# Patient Record
Sex: Male | Born: 1975 | Race: White | Hispanic: No | Smoking: Never smoker
Health system: Southern US, Community
[De-identification: ages and names within clinical notes are randomized; demographics above are authoritative.]

## PROBLEM LIST (undated history)

## (undated) DIAGNOSIS — E785 Hyperlipidemia, unspecified: Secondary | ICD-10-CM

## (undated) DIAGNOSIS — T7840XA Allergy, unspecified, initial encounter: Secondary | ICD-10-CM

## (undated) DIAGNOSIS — I10 Essential (primary) hypertension: Secondary | ICD-10-CM

## (undated) HISTORY — PX: NO PAST SURGERIES: SHX2092

## (undated) HISTORY — DX: Hyperlipidemia, unspecified: E78.5

## (undated) HISTORY — DX: Allergy, unspecified, initial encounter: T78.40XA

## (undated) HISTORY — DX: Essential (primary) hypertension: I10

---

## 2010-01-14 ENCOUNTER — Encounter: Admission: RE | Admit: 2010-01-14 | Discharge: 2010-01-14 | Payer: Self-pay | Admitting: Occupational Medicine

## 2010-09-24 ENCOUNTER — Emergency Department (HOSPITAL_BASED_OUTPATIENT_CLINIC_OR_DEPARTMENT_OTHER)
Admission: EM | Admit: 2010-09-24 | Discharge: 2010-09-24 | Payer: Self-pay | Source: Home / Self Care | Admitting: Emergency Medicine

## 2014-10-13 ENCOUNTER — Other Ambulatory Visit: Payer: Self-pay | Admitting: Occupational Medicine

## 2014-10-13 ENCOUNTER — Ambulatory Visit
Admission: RE | Admit: 2014-10-13 | Discharge: 2014-10-13 | Disposition: A | Discharge status: Home / Self Care | Payer: No Typology Code available for payment source | Source: Ambulatory Visit | Attending: Occupational Medicine | Admitting: Occupational Medicine

## 2014-10-13 DIAGNOSIS — Z139 Encounter for screening, unspecified: Secondary | ICD-10-CM

## 2015-05-11 IMAGING — CR DG CHEST 1V
1 series · 1 of 1 positions shown · non-contrast
Comparison: 01/14/2010

CLINICAL DATA: Annual recertification screening for employment [REDACTED]
[employer]

EXAM:
CHEST - 1 VIEW

[view not recorded]
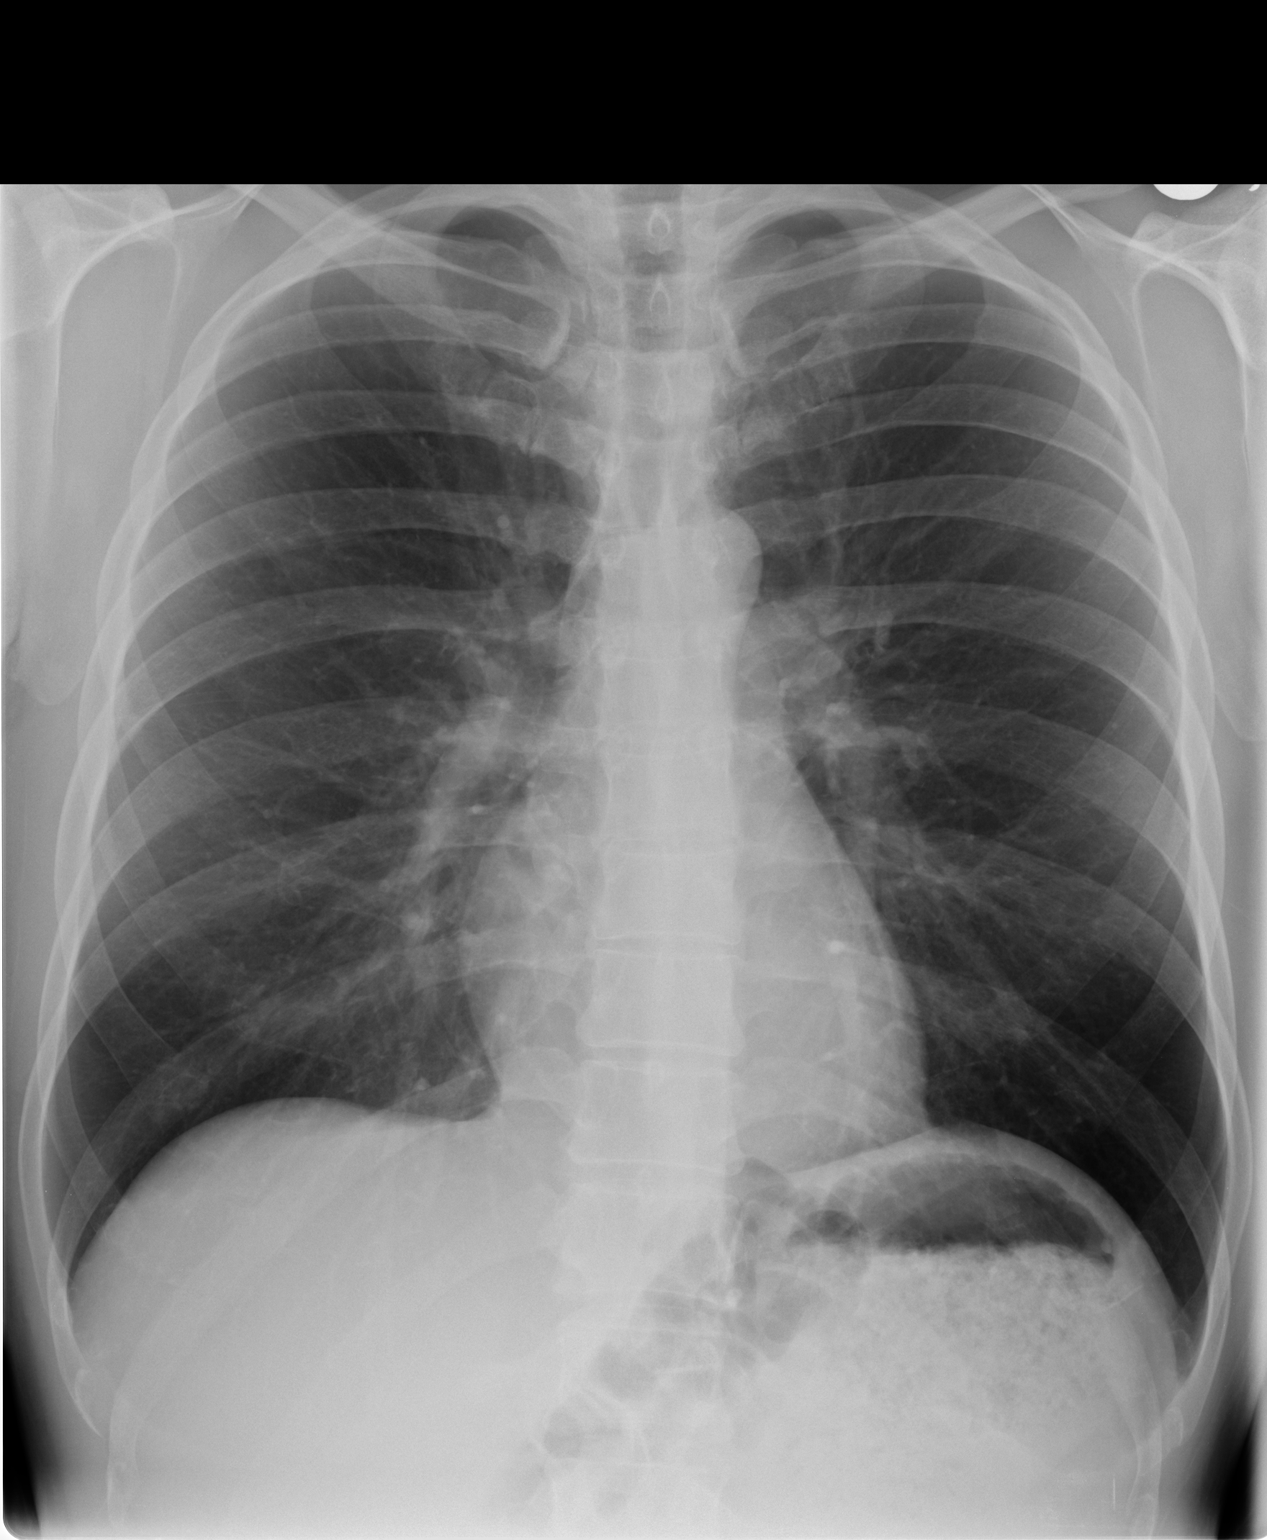

[1 of 1 positions shown; findings below may reference images not displayed]

FINDINGS: The heart size and mediastinal contours are within normal limits.
Both lungs are clear. The visualized skeletal structures are
unremarkable.
IMPRESSION: No active disease.

## 2016-03-17 ENCOUNTER — Encounter (HOSPITAL_BASED_OUTPATIENT_CLINIC_OR_DEPARTMENT_OTHER): Payer: Self-pay | Admitting: Emergency Medicine

## 2016-03-17 ENCOUNTER — Emergency Department (HOSPITAL_BASED_OUTPATIENT_CLINIC_OR_DEPARTMENT_OTHER)
Admission: EM | Admit: 2016-03-17 | Discharge: 2016-03-17 | Disposition: A | Discharge status: Home / Self Care | Payer: Commercial Managed Care - HMO | Attending: Emergency Medicine | Admitting: Emergency Medicine

## 2016-03-17 DIAGNOSIS — H6691 Otitis media, unspecified, right ear: Secondary | ICD-10-CM | POA: Diagnosis not present

## 2016-03-17 DIAGNOSIS — H7291 Unspecified perforation of tympanic membrane, right ear: Secondary | ICD-10-CM

## 2016-03-17 DIAGNOSIS — H9201 Otalgia, right ear: Secondary | ICD-10-CM | POA: Diagnosis present

## 2016-03-17 MED ORDER — AMOXICILLIN 500 MG PO TABS: 1000.0000 mg | ORAL_TABLET | Freq: Three times a day (TID) | ORAL | Status: DC

## 2016-03-17 MED ORDER — NAPROXEN 500 MG PO TABS: ORAL_TABLET | ORAL | Status: DC

## 2016-03-17 MED ORDER — AMOXICILLIN 500 MG PO CAPS
1000.0000 mg | ORAL_CAPSULE | Freq: Once | ORAL | Status: AC
  Administered 2016-03-17: 1000 mg via ORAL
  Filled 2016-03-17: qty 2

## 2016-03-17 NOTE — ED Notes (Signed)
Pt verbalizes understanding of d/c instructions and denies any further needs at this time. 

## 2016-03-17 NOTE — ED Notes (Signed)
Patient states that he has had a sinus infection all week, and now his right ear hurts

## 2016-03-17 NOTE — ED Provider Notes (Signed)
CSN: [id number]     Arrival date & time 03/17/16  2328 History  By signing my name below, I, Ephriam Jenkins, attest that this documentation has been prepared under the direction and in the presence of Shanon Rosser, MD. Electronically signed, Ephriam Jenkins, ED Scribe. 03/17/2016. 11:43 PM.   Chief Complaint  Patient presents with  . Ear Pain    The history is provided by the patient. No language interpreter was used.   HPI Comments: Shawn Byrd is a 40 y.o. male who presents to the Emergency Department complaining of sudden onset throbbing right ear pain onset tonight. The pain severity varies. He has been having sinus pressure with associated nasal congestion that started one week ago. He fell asleep one hour ago and woke up with the ear pain. Pt also states that he hears intermittent "crackling" noises in his right ear. Pt has taken ibuprofen prior to arrival with minimal relief. Pt denies any fever or drainage from the ear.  History reviewed. No pertinent past medical history. History reviewed. No pertinent past surgical history. History reviewed. No pertinent family history. Social History  Substance Use Topics  . Smoking status: Never Smoker   . Smokeless tobacco: None  . Alcohol Use: Yes     Comment: occ    Review of Systems  A complete 10 system review of systems was obtained and all systems are negative except as noted in the HPI and PMH.    Allergies  Review of patient's allergies indicates no known allergies.  Home Medications   Prior to Admission medications   Medication Sig Start Date End Date Taking? Authorizing Provider  amoxicillin (AMOXIL) 500 MG tablet Take 2 tablets (1,000 mg total) by mouth 3 (three) times daily. 03/17/16   Caydin Yeatts, MD  naproxen (NAPROSYN) 500 MG tablet Take one tablet twice daily as needed for pain. 03/17/16   Geselle Cardosa, MD   BP 153/105 mmHg  Pulse 71  Temp(Src) 98.2 F (36.8 C) (Oral)  Resp 18  Ht 5\' 10"  (1.778 m)  Wt 150 lb (68.04 kg)   BMI 21.52 kg/m2  SpO2 100% Physical Exam General: Well-developed, well-nourished male in no acute distress; appearance consistent with age of record HENT: normocephalic; atraumatic, nasal congestion, pharynx normal; left TM normal; right TM erythematous, buldging with small fluid in auditory canal. Eyes: pupils equal, round and reactive to light; extraocular muscles intact Neck: supple Heart: regular rate and rhythm Lungs: clear to auscultation bilaterally Abdomen: soft; nondistended; nontender; bowel sounds present Extremities: No deformity; full range of motion; pulses normal Neurologic: Awake, alert and oriented; motor function intact in all extremities and symmetric; no facial droop Skin: Warm and dry Psychiatric: Normal mood and affect   ED Course  Procedures  DIAGNOSTIC STUDIES: Oxygen Saturation is 100% on RA, normal by my interpretation.    MDM   Final diagnoses:  Otitis media with rupture of tympanic membrane, right   I personally performed the services described in this documentation, which was scribed in my presence. The recorded information has been reviewed and is accurate.     Shanon Rosser, MD 03/17/16 [phone]

## 2019-09-23 ENCOUNTER — Other Ambulatory Visit: Payer: Self-pay

## 2019-09-23 ENCOUNTER — Ambulatory Visit (INDEPENDENT_AMBULATORY_CARE_PROVIDER_SITE_OTHER): Payer: 59 | Admitting: Family Medicine

## 2019-09-23 ENCOUNTER — Encounter: Payer: Self-pay | Admitting: Family Medicine

## 2019-09-23 VITALS — Temp 98.6°F | Ht 70.0 in | Wt 175.0 lb

## 2019-09-23 DIAGNOSIS — R030 Elevated blood-pressure reading, without diagnosis of hypertension: Secondary | ICD-10-CM | POA: Diagnosis not present

## 2019-09-23 DIAGNOSIS — D582 Other hemoglobinopathies: Secondary | ICD-10-CM | POA: Diagnosis not present

## 2019-09-23 DIAGNOSIS — R739 Hyperglycemia, unspecified: Secondary | ICD-10-CM | POA: Diagnosis not present

## 2019-09-23 DIAGNOSIS — E782 Mixed hyperlipidemia: Secondary | ICD-10-CM | POA: Diagnosis not present

## 2019-09-23 MED ORDER — ROSUVASTATIN CALCIUM 10 MG PO TABS: 10.0000 mg | ORAL_TABLET | Freq: Every day | ORAL | 3 refills | Status: DC

## 2019-09-23 NOTE — Progress Notes (Signed)
Chief Complaint  Patient presents with  . 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  The patient has not had PCP in a couple decades.  Due to COVID-19 pandemic, we are interacting via web portal for an electronic face-to-face visit. I verified patient's ID using 2 identifiers. Patient agreed to proceed with visit via this method. Patient is at work, I am at home. Patient and I are present for visit.   Pt recently had CPE through work. Told that his cholesterol is elevated. His TG's were 261, LDL 118, HDL 54, tot cholesterol 217. He does have a famhx of high cholesterol, unsure if family members are treated. No CP or SOB. Diet is fair overall. He does not exercise anymore, sedentary for past 2 years after a knee injury. Even when he was running, his numbers were creeping up.   Other labs of note include a glucose of 113, Cr of 1.29, Bun 12, eGFR 67. Describes himself as more of an athletic build. Never told he had kidney issues. Not currently weight training or taking supplements. +famhx of diabetes in mom. Hemoglobin was 18. He does not smoke and denies second hand smoke exposure. He does not snore at night. No hypo/hyperbaric chamber use. Takes no meds routinely. Liver enzymes normal.  Had his BP checked with EMT colleagues. 128/90 today, reports this has been an issue in past. Diet/exercise as above.   Past Medical History:  Diagnosis Date  . Hyperlipidemia   . Hypertension    Past Surgical History:  Procedure Laterality Date  . NO PAST SURGERIES     Family History  Problem Relation Age of Onset  . Diabetes Mother   . COPD Father   . High Cholesterol Father   . Colon cancer Maternal Grandfather 40  . High Cholesterol Paternal Grandmother   . High Cholesterol Paternal Grandfather    No Known Allergies  Takes no meds  routinely.  ROS Cardiovascular: Denies chest pain  Respiratory: Denies dyspnea   OBJECTIVE: Temp 98.6 F (37 C) (Oral)   Ht 5' 10"  (1.778 m)   Wt 175 lb (79.4 kg)   BMI 25.11 kg/m  No conversational dyspnea Age appropriate judgment and insight Nml affect and mood  ASSESSMENT/PLAN: Mixed hyperlipidemia - Plan: rosuvastatin (CRESTOR) 10 MG tablet, Comp Met (CMET), Lipid Profile  Elevated blood pressure reading  Elevated hemoglobin (HCC) - Plan: CBC w/Diff  Hyperglycemia - Plan: HgB A1c  Given famhx and persistence of high chol, will start Crestor. Ck lipids in 4 weeks.  Counseled on diet and exercise. Monitor BP's at work. Ck CBC. No immediate cause from hx. If still elevated, will ck Jak2 ab's and EPO. F/u on high sugar. Reassurance for Cr.  Patient should return 1 week for labs, 1 mo w me to reck above. The patient voiced understanding and agreement to the plan.   [place], DO 09/23/19  1:01 PM

## 2019-10-21 ENCOUNTER — Encounter: Payer: Self-pay | Admitting: Family Medicine

## 2019-10-28 ENCOUNTER — Other Ambulatory Visit (INDEPENDENT_AMBULATORY_CARE_PROVIDER_SITE_OTHER): Payer: 59

## 2019-10-28 ENCOUNTER — Other Ambulatory Visit: Payer: Self-pay

## 2019-10-28 DIAGNOSIS — R739 Hyperglycemia, unspecified: Secondary | ICD-10-CM

## 2019-10-28 DIAGNOSIS — E782 Mixed hyperlipidemia: Secondary | ICD-10-CM | POA: Diagnosis not present

## 2019-10-28 DIAGNOSIS — D582 Other hemoglobinopathies: Secondary | ICD-10-CM | POA: Diagnosis not present

## 2019-10-28 LAB — CBC WITH DIFFERENTIAL/PLATELET
Basophils Absolute: 0 10*3/uL (ref 0.0–0.1)
Basophils Relative: 0.4 % (ref 0.0–3.0)
Eosinophils Absolute: 0.1 10*3/uL (ref 0.0–0.7)
Eosinophils Relative: 0.7 % (ref 0.0–5.0)
HCT: 48.9 % (ref 39.0–52.0)
Hemoglobin: 17 g/dL (ref 13.0–17.0)
Lymphocytes Relative: 15.9 % (ref 12.0–46.0)
Lymphs Abs: 1.2 10*3/uL (ref 0.7–4.0)
MCHC: 34.7 g/dL (ref 30.0–36.0)
MCV: 95.5 fl (ref 78.0–100.0)
Monocytes Absolute: 0.5 10*3/uL (ref 0.1–1.0)
Monocytes Relative: 6.7 % (ref 3.0–12.0)
Neutro Abs: 5.6 10*3/uL (ref 1.4–7.7)
Neutrophils Relative %: 76.3 % (ref 43.0–77.0)
Platelets: 236 10*3/uL (ref 150.0–400.0)
RBC: 5.12 Mil/uL (ref 4.22–5.81)
RDW: 12.3 % (ref 11.5–15.5)
WBC: 7.3 10*3/uL (ref 4.0–10.5)

## 2019-10-28 LAB — COMPREHENSIVE METABOLIC PANEL
ALT: 39 U/L (ref 0–53)
AST: 28 U/L (ref 0–37)
Albumin: 4.7 g/dL (ref 3.5–5.2)
Alkaline Phosphatase: 65 U/L (ref 39–117)
BUN: 12 mg/dL (ref 6–23)
CO2: 28 mEq/L (ref 19–32)
Calcium: 10 mg/dL (ref 8.4–10.5)
Chloride: 102 mEq/L (ref 96–112)
Creatinine, Ser: 1.27 mg/dL (ref 0.40–1.50)
GFR: 61.44 mL/min (ref >60.00)
Glucose, Bld: 102 mg/dL — ABNORMAL HIGH (ref 70–99)
Potassium: 4.3 mEq/L (ref 3.5–5.1)
Sodium: 138 mEq/L (ref 135–145)
Total Bilirubin: 1 mg/dL (ref 0.2–1.2)
Total Protein: 7.3 g/dL (ref 6.0–8.3)

## 2019-10-28 LAB — LIPID PANEL
Cholesterol: 141 mg/dL (ref 0–200)
HDL: 59 mg/dL (ref >39.00)
LDL Cholesterol: 59 mg/dL (ref 0–99)
NonHDL: 81.51
Total CHOL/HDL Ratio: 2
Triglycerides: 111 mg/dL (ref 0.0–149.0)
VLDL: 22.2 mg/dL (ref 0.0–40.0)

## 2019-10-28 LAB — HEMOGLOBIN A1C: Hgb A1c MFr Bld: 5.3 % (ref 4.6–6.5)

## 2019-11-08 ENCOUNTER — Ambulatory Visit: Payer: 59 | Admitting: Family Medicine

## 2019-11-19 ENCOUNTER — Other Ambulatory Visit: Payer: Self-pay

## 2019-11-19 ENCOUNTER — Ambulatory Visit: Payer: 59 | Admitting: Family Medicine

## 2019-11-19 ENCOUNTER — Encounter: Payer: Self-pay | Admitting: Family Medicine

## 2019-11-19 VITALS — BP 130/100 | HR 108 | Temp 96.8°F | Ht 70.0 in | Wt 178.2 lb

## 2019-11-19 DIAGNOSIS — I10 Essential (primary) hypertension: Secondary | ICD-10-CM | POA: Diagnosis not present

## 2019-11-19 DIAGNOSIS — E782 Mixed hyperlipidemia: Secondary | ICD-10-CM | POA: Diagnosis not present

## 2019-11-19 MED ORDER — AMLODIPINE BESYLATE 5 MG PO TABS: 5.0000 mg | ORAL_TABLET | Freq: Every day | ORAL | 3 refills | Status: DC

## 2019-11-19 MED ORDER — ROSUVASTATIN CALCIUM 10 MG PO TABS: 10.0000 mg | ORAL_TABLET | Freq: Every day | ORAL | 2 refills | Status: DC

## 2019-11-19 NOTE — Progress Notes (Signed)
CC: Med ck  Subjective: Hyperlipidemia Patient presents for Hyperlipidemia follow up. Currently taking Crestor 10 mg/d and compliance with treatment thus far has been good. He denies myalgias. He is adhering to a healthy diet. Exercise: walking The patient is not known to have coexisting coronary artery disease.  Hx of high blood pressure. Has never taken any meds. Diet/exercise as above. No CP or SOB.   ROS: Heart: Denies chest pain or palpitations Lungs: Denies SOB or cough  Past Medical History:  Diagnosis Date  . Hyperlipidemia   . Hypertension     Objective: BP (!) 130/100 (BP Location: Left Arm, Patient Position: Sitting, Cuff Size: Normal)   Pulse (!) 108   Temp (!) 96.8 F (36 C) (Temporal)   Ht 5\' 10"  (1.778 m)   Wt 178 lb 4 oz (80.9 kg)   SpO2 99%   BMI 25.58 kg/m  General: Awake, appears stated age HEENT: MMM Heart: RRR, no LE edema, no bruits Lungs: CTAB, no rales, wheezes or rhonchi. No accessory muscle use Psych: Age appropriate judgment and insight, normal affect and mood  Assessment and Plan: Mixed hyperlipidemia - Plan: rosuvastatin (CRESTOR) 10 MG tablet  Essential hypertension - Plan: amLODipine (NORVASC) 5 MG tablet  Cont Crestor 10 mg/d. Counseled on diet and exercise.  Start Norvasc 5 mg/d.  F/u in 6 weeks. The patient voiced understanding and agreement to the plan.  [place], DO 11/19/19  1:02 PM

## 2019-11-19 NOTE — Patient Instructions (Addendum)
Keep the diet clean and stay active.  Write down your BP readings.   Let us know if you need anything.

## 2019-12-30 ENCOUNTER — Other Ambulatory Visit: Payer: Self-pay

## 2019-12-31 ENCOUNTER — Ambulatory Visit: Payer: 59 | Admitting: Family Medicine

## 2019-12-31 ENCOUNTER — Encounter: Payer: Self-pay | Admitting: Family Medicine

## 2019-12-31 VITALS — BP 102/82 | HR 109 | Temp 96.5°F | Ht 70.0 in | Wt 181.2 lb

## 2019-12-31 DIAGNOSIS — I10 Essential (primary) hypertension: Secondary | ICD-10-CM

## 2019-12-31 NOTE — Patient Instructions (Addendum)
I want your blood pressure <140/90 consistently.   Keep the diet clean and stay active.  Check your blood pressure at home and send me a message in 2 weeks with your readings.  Check in the morning, afternoon and evening, alternating time of day to check.   Continue the Norvasc for now.

## 2019-12-31 NOTE — Progress Notes (Signed)
Chief Complaint  Patient presents with  . Follow-up    BP medication not working    Subjective Shawn Byrd is a 44 y.o. male who presents for hypertension follow up. He does monitor home blood pressures. Blood pressures ranging from 120-130's/80-90's on average. DBP usually in 90's around early afternoon. He is compliant with medication- Norvasc 5 mg/d. Patient has these side effects of medication: none He is adhering to a healthy diet overall. Current exercise: walking   Past Medical History:  Diagnosis Date  . Hyperlipidemia   . Hypertension     Review of Systems Cardiovascular: no chest pain Respiratory:  no shortness of breath  Exam BP 102/82 (BP Location: Left Arm, Patient Position: Sitting, Cuff Size: Normal)   Pulse (!) 109   Temp (!) 96.5 F (35.8 C) (Temporal)   Ht 5\' 10"  (1.778 m)   Wt 181 lb 4 oz (82.2 kg)   SpO2 98%   BMI 26.01 kg/m  General:  well developed, well nourished, in no apparent distress Heart: RRR, no bruits, no LE edema Lungs: clear to auscultation, no accessory muscle use Psych: well oriented with normal range of affect and appropriate judgment/insight  Essential hypertension  Today's appt is around the same time his DBP is high. Cont Norvasc. Ck readings at home. Send message in 2 weeks. May have him come in for nurse visit and bring his BP monitor. F/u pending this.  Counseled on diet and exercise. The patient voiced understanding and agreement to the plan.  [place], DO 12/31/19  12:58 PM

## 2020-01-24 ENCOUNTER — Other Ambulatory Visit: Payer: Self-pay | Admitting: Family Medicine

## 2020-01-24 DIAGNOSIS — E782 Mixed hyperlipidemia: Secondary | ICD-10-CM

## 2020-01-24 MED ORDER — ROSUVASTATIN CALCIUM 10 MG PO TABS: 10.0000 mg | ORAL_TABLET | Freq: Every day | ORAL | 5 refills | Status: DC

## 2020-03-25 ENCOUNTER — Other Ambulatory Visit: Payer: Self-pay | Admitting: Family Medicine

## 2020-03-25 DIAGNOSIS — I10 Essential (primary) hypertension: Secondary | ICD-10-CM

## 2020-07-30 ENCOUNTER — Other Ambulatory Visit: Payer: Self-pay | Admitting: Family Medicine

## 2020-07-30 DIAGNOSIS — I10 Essential (primary) hypertension: Secondary | ICD-10-CM

## 2020-08-25 ENCOUNTER — Ambulatory Visit
Admission: RE | Admit: 2020-08-25 | Discharge: 2020-08-25 | Disposition: A | Discharge status: Home / Self Care | Payer: Self-pay | Source: Ambulatory Visit | Attending: Nurse Practitioner | Admitting: Nurse Practitioner

## 2020-08-25 ENCOUNTER — Other Ambulatory Visit: Payer: Self-pay | Admitting: Nurse Practitioner

## 2020-08-25 ENCOUNTER — Other Ambulatory Visit: Payer: Self-pay

## 2020-08-25 DIAGNOSIS — Z0289 Encounter for other administrative examinations: Secondary | ICD-10-CM

## 2020-09-01 ENCOUNTER — Encounter: Payer: Self-pay | Admitting: Family Medicine

## 2020-09-01 ENCOUNTER — Ambulatory Visit: Payer: 59 | Admitting: Family Medicine

## 2020-09-01 ENCOUNTER — Other Ambulatory Visit: Payer: Self-pay

## 2020-09-01 VITALS — BP 112/82 | HR 120 | Temp 98.5°F | Ht 70.0 in | Wt 187.0 lb

## 2020-09-01 DIAGNOSIS — Z125 Encounter for screening for malignant neoplasm of prostate: Secondary | ICD-10-CM | POA: Diagnosis not present

## 2020-09-01 DIAGNOSIS — R319 Hematuria, unspecified: Secondary | ICD-10-CM

## 2020-09-01 DIAGNOSIS — R739 Hyperglycemia, unspecified: Secondary | ICD-10-CM

## 2020-09-01 DIAGNOSIS — E781 Pure hyperglyceridemia: Secondary | ICD-10-CM | POA: Diagnosis not present

## 2020-09-01 NOTE — Patient Instructions (Signed)
We will get your labs in the next 2 weeks screening for prostate cancer (PSA), checking an A1c and your cholesterol.   We will be in touch with your urine results. If there is blood, we will recheck in 2 weeks.   Keep the diet clean and stay active.  Let us know if you need anything.

## 2020-09-01 NOTE — Progress Notes (Signed)
Chief Complaint  Patient presents with  . Follow-up    Followup labs from work    Subjective: Patient is a 44 y.o. male here for f/u from work CPE.  The patient had a physical at work where he was told he has blood in his urine.  He does not have any urinary symptoms and does not see any blood in his urine.  He does have a family history of prostate issues and is very concerned about this.  No unexplained weight loss.  The patient has a history of hypertension.  He has not been checking his blood pressure at home.  It was quite high at the physical he had at work.  He is compliant with his Norvasc 5 mg daily.  No adverse effects.  The patient is compliant with his Crestor 20 mg daily.  No adverse effects.  His triglycerides were 212 at his work physical.  He is interested in following up with that.  Past Medical History:  Diagnosis Date  . Hyperlipidemia   . Hypertension     Objective: BP 112/82 (BP Location: Left Arm, Patient Position: Sitting, Cuff Size: Normal)   Pulse (!) 120   Temp 98.5 F (36.9 C) (Oral)   Ht 5\' 10"  (1.778 m)   Wt 187 lb (84.8 kg)   SpO2 97%   BMI 26.83 kg/m  General: Awake, appears stated age Heart: RRR (heart rate around 84), no lower extremity edema Lungs: CTAB, no rales, wheezes or rhonchi. No accessory muscle use Psych: Age appropriate judgment and insight, normal affect and mood  Assessment and Plan: Hematuria, unspecified type - Plan: Urine Microscopic Only  Hypertriglyceridemia  Hyperglycemia  Screening for prostate cancer  1.  Urine microscopy, if positive for red blood cells, will recheck in 2 weeks.  We will refer if confirmatory test is also positive.  If negative, we will recheck in 3 months. 2.  Check lipids in the next 2 weeks.  Continue Crestor 20 mg daily. 3.  Check A1c in the next 2 weeks.  Last year his glucose was 102 and his A1c was 5.3.  I suspect a similar result this time. 4.  Check PSA in the next 2 weeks.  Counseled on  risks and benefits, given his family history he elected to undergo testing. The patient voiced understanding and agreement to the plan.  [place], DO 09/01/20  3:24 PM

## 2020-09-02 ENCOUNTER — Other Ambulatory Visit: Payer: Self-pay | Admitting: Family Medicine

## 2020-09-02 DIAGNOSIS — Z125 Encounter for screening for malignant neoplasm of prostate: Secondary | ICD-10-CM

## 2020-09-02 DIAGNOSIS — R739 Hyperglycemia, unspecified: Secondary | ICD-10-CM

## 2020-09-02 DIAGNOSIS — E781 Pure hyperglyceridemia: Secondary | ICD-10-CM

## 2020-09-02 DIAGNOSIS — R319 Hematuria, unspecified: Secondary | ICD-10-CM

## 2020-09-02 DIAGNOSIS — I10 Essential (primary) hypertension: Secondary | ICD-10-CM

## 2020-09-02 LAB — URINALYSIS, MICROSCOPIC ONLY

## 2020-09-24 ENCOUNTER — Other Ambulatory Visit: Payer: 59

## 2020-10-02 ENCOUNTER — Other Ambulatory Visit: Payer: 59

## 2020-11-22 ENCOUNTER — Other Ambulatory Visit: Payer: Self-pay | Admitting: Family Medicine

## 2020-11-22 DIAGNOSIS — I10 Essential (primary) hypertension: Secondary | ICD-10-CM

## 2021-01-21 ENCOUNTER — Other Ambulatory Visit: Payer: Self-pay | Admitting: Family Medicine

## 2021-01-21 DIAGNOSIS — E782 Mixed hyperlipidemia: Secondary | ICD-10-CM

## 2021-03-22 ENCOUNTER — Other Ambulatory Visit: Payer: Self-pay | Admitting: Family Medicine

## 2021-03-22 DIAGNOSIS — I10 Essential (primary) hypertension: Secondary | ICD-10-CM

## 2021-03-23 IMAGING — CR DG CHEST 1V
1 series · 1 of 1 positions shown · non-contrast
Comparison: October 13, 2014

CLINICAL DATA: Pre employment chest x-ray.

EXAM:
CHEST  1 VIEW

[w chest pa]
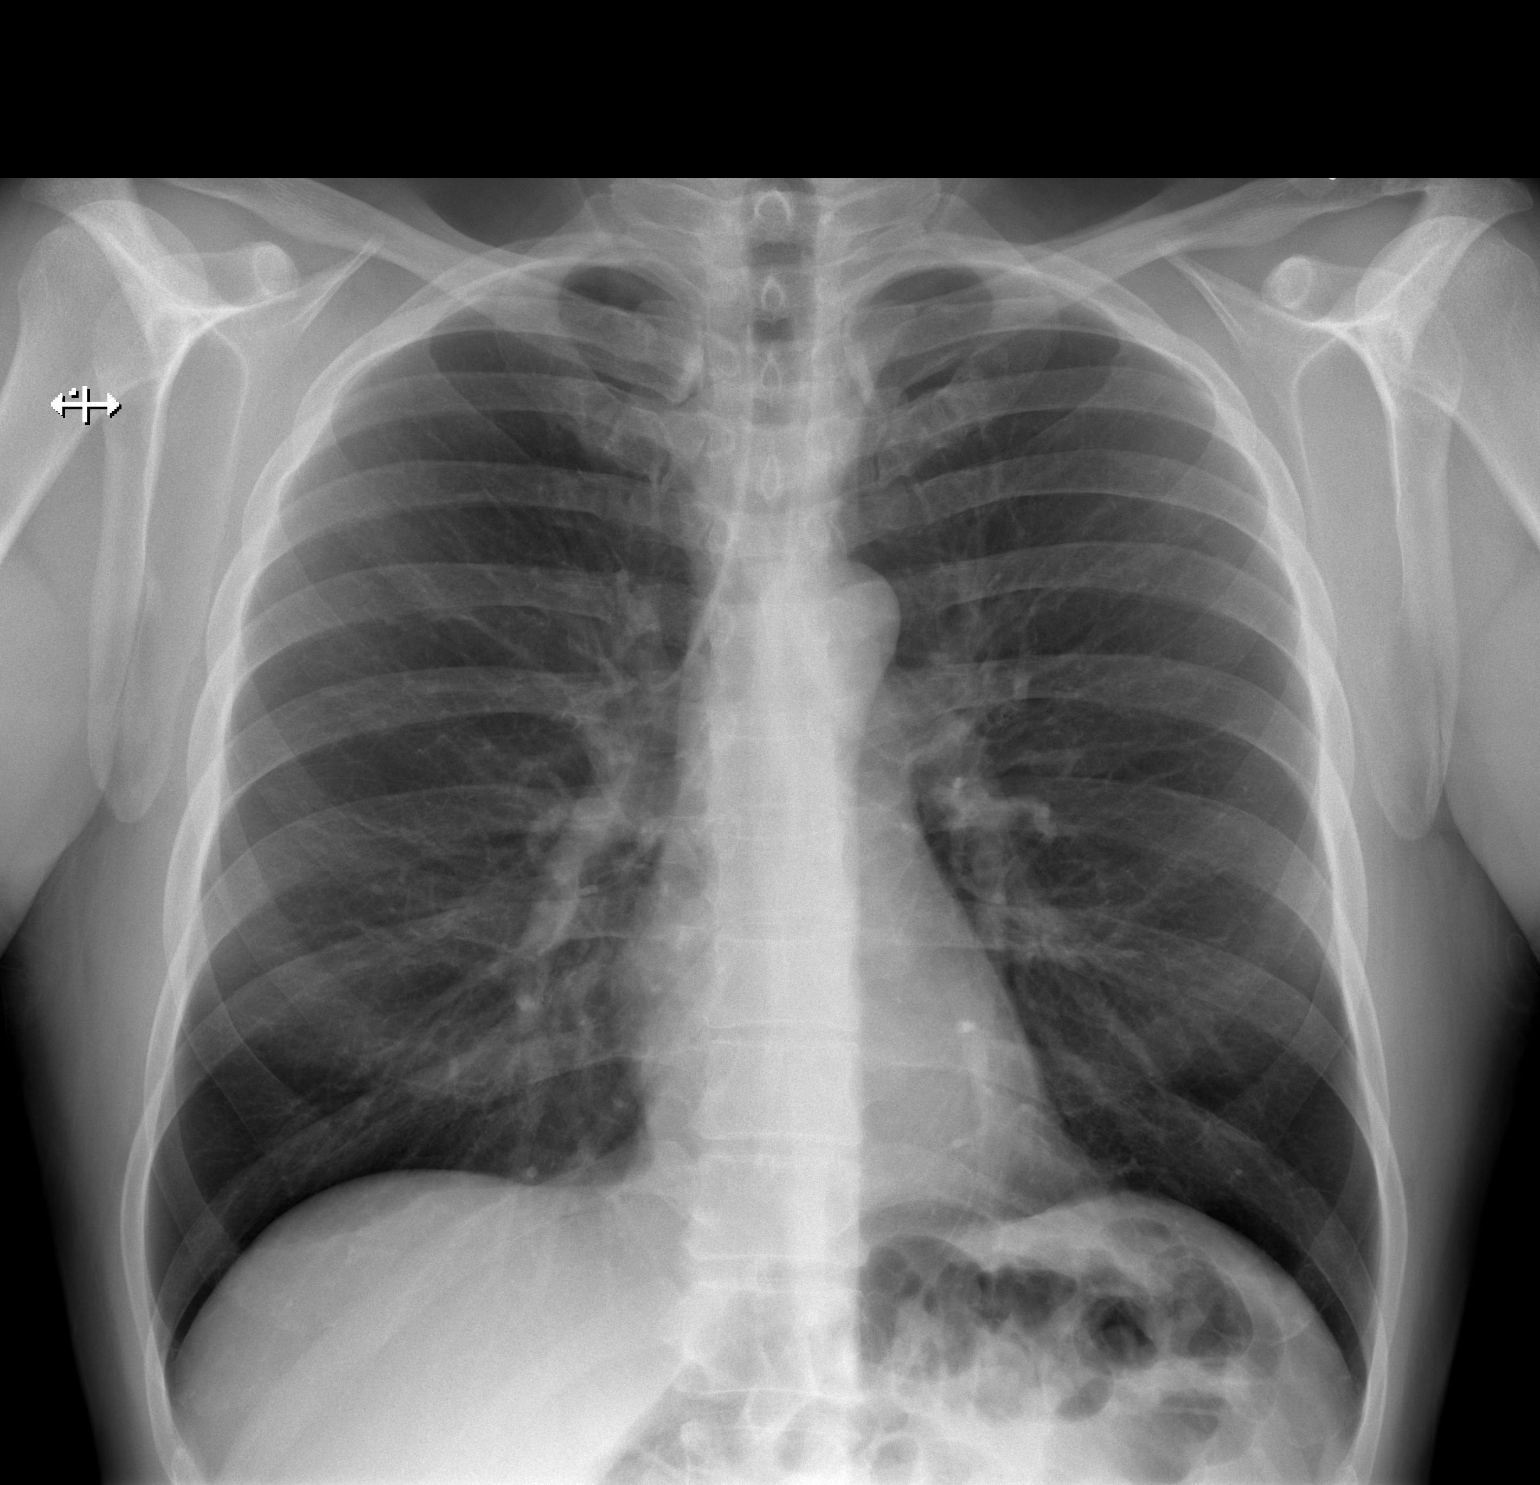

[1 of 1 positions shown; findings below may reference images not displayed]

FINDINGS: The heart size and mediastinal contours are within normal limits.
Both lungs are clear. The visualized skeletal structures are
unremarkable.
IMPRESSION: No active disease.

## 2021-07-20 ENCOUNTER — Other Ambulatory Visit: Payer: Self-pay | Admitting: Family Medicine

## 2021-07-20 DIAGNOSIS — E782 Mixed hyperlipidemia: Secondary | ICD-10-CM

## 2021-07-20 DIAGNOSIS — I10 Essential (primary) hypertension: Secondary | ICD-10-CM

## 2021-09-19 DIAGNOSIS — C4491 Basal cell carcinoma of skin, unspecified: Secondary | ICD-10-CM

## 2021-09-19 HISTORY — DX: Basal cell carcinoma of skin, unspecified: C44.91

## 2021-11-26 ENCOUNTER — Other Ambulatory Visit: Payer: Self-pay | Admitting: Family Medicine

## 2021-11-26 DIAGNOSIS — I10 Essential (primary) hypertension: Secondary | ICD-10-CM

## 2022-01-25 ENCOUNTER — Other Ambulatory Visit: Payer: Self-pay | Admitting: Family Medicine

## 2022-01-25 DIAGNOSIS — E782 Mixed hyperlipidemia: Secondary | ICD-10-CM

## 2022-03-28 ENCOUNTER — Telehealth: Payer: Self-pay | Admitting: Family Medicine

## 2022-03-28 NOTE — Telephone Encounter (Signed)
Received a fax copy for a refill for Amlodipine 5 mg from [place] with PCP on 09/01/2020 Last RF #30 with 3 refills on 11/26/2021 Called the patient and left a detailed message to schedule an appointment with Dr. Nani Ravens in order to continued getting refills.

## 2022-04-07 ENCOUNTER — Ambulatory Visit: Payer: 59 | Admitting: Family Medicine

## 2022-04-07 ENCOUNTER — Encounter: Payer: Self-pay | Admitting: Family Medicine

## 2022-04-07 VITALS — BP 122/86 | HR 97 | Temp 98.2°F | Resp 16 | Wt 191.0 lb

## 2022-04-07 DIAGNOSIS — Z1211 Encounter for screening for malignant neoplasm of colon: Secondary | ICD-10-CM | POA: Diagnosis not present

## 2022-04-07 DIAGNOSIS — E782 Mixed hyperlipidemia: Secondary | ICD-10-CM | POA: Diagnosis not present

## 2022-04-07 DIAGNOSIS — I10 Essential (primary) hypertension: Secondary | ICD-10-CM | POA: Diagnosis not present

## 2022-04-07 MED ORDER — ROSUVASTATIN CALCIUM 10 MG PO TABS: ORAL_TABLET | ORAL | 5 refills | Status: DC

## 2022-04-07 MED ORDER — AMLODIPINE BESYLATE 5 MG PO TABS: ORAL_TABLET | ORAL | 5 refills | Status: DC

## 2022-04-07 NOTE — Progress Notes (Signed)
Chief Complaint  Patient presents with   Hypertension    Here for follow up    Subjective Shawn Byrd is a 46 y.o. male who presents for hypertension follow up. He does not monitor home blood pressures. He is compliant with medication- Norvasc 5 mg/d. Patient has these side effects of medication: none He is sometimes adhering to a healthy diet overall. Current exercise: active at work No Cp or SOB.  Mixed hyperlipidemia Patient presents for mixed hyperlipidemia follow up. Currently being treated with Crestor 10 mg/d and compliance with treatment thus far has been good. He denies myalgias. Diet/exercise as above.  The patient is not known to have coexisting coronary artery disease.   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   Hyperlipidemia    Hypertension     Exam BP 122/86 (BP Location: Left Arm, Cuff Size: Normal)   Pulse 97   Temp 98.2 F (36.8 C) (Oral)   Resp 16   Wt 191 lb (86.6 kg)   SpO2 99%   BMI 27.41 kg/m  General:  well developed, well nourished, in no apparent distress Heart: RRR, no bruits, no LE edema Lungs: clear to auscultation, no accessory muscle use Psych: well oriented with normal range of affect and appropriate judgment/insight  Essential hypertension - Plan: amLODipine (NORVASC) 5 MG tablet  Mixed hyperlipidemia - Plan: rosuvastatin (CRESTOR) 10 MG tablet  Screen for colon cancer - Plan: Ambulatory referral to Gastroenterology  Chronic, stable. Counseled on diet and exercise.  Monitor blood pressure at home for the next month.  If greater than 140/90, he will let me know and we will have to make a medication adjustment.  For now continue Norvasc 5 g daily. Chronic, stable.  Continue Crestor 10 mg daily.  He had labs done through work showing normal triglycerides and LDL levels.  We will recheck labs in 6 months at his physical. F/u in 6 mo or prn. The patient voiced understanding and agreement to  the plan.  [place], DO 04/07/22  11:57 AM

## 2022-04-07 NOTE — Patient Instructions (Signed)
Keep the diet clean and stay active.  For the next month: Check your blood pressures 2-3 times per week, alternating the time of day you check it. If it is high, considering waiting 1-2 minutes and rechecking. If it gets higher, your anxiety is likely creeping up and we should avoid rechecking. If >140/90 consistently, please let me know.  If you do not hear anything about your referral in the next 1-2 weeks, call our office and ask for an update.  Let us know if you need anything.

## 2022-07-01 ENCOUNTER — Encounter: Payer: Self-pay | Admitting: Internal Medicine

## 2022-08-01 ENCOUNTER — Ambulatory Visit (AMBULATORY_SURGERY_CENTER): Payer: Self-pay

## 2022-08-01 VITALS — Ht 70.0 in | Wt 192.0 lb

## 2022-08-01 DIAGNOSIS — Z1211 Encounter for screening for malignant neoplasm of colon: Secondary | ICD-10-CM

## 2022-08-01 MED ORDER — NA SULFATE-K SULFATE-MG SULF 17.5-3.13-1.6 GM/177ML PO SOLN: 1.0000 | Freq: Once | ORAL | 0 refills | Status: AC

## 2022-08-01 NOTE — Progress Notes (Signed)
No egg or soy allergy known to patient   NO past intubation or sedation  No FH of Malignant Hyperthermia  Pt is not on diet pills Pt is not on  home 02  Pt is not on blood thinners  Pt denies issues with constipation  No A fib or A flutter Have any cardiac testing pending--no Pt instructed to use Singlecare.com or GoodRx for a price reduction on prep    Patient's chart reviewed by Osvaldo Angst CNRA prior to previsit and patient appropriate for the [facility].  Previsit completed and red dot placed by patient's name on their procedure day (on provider's schedule).

## 2022-08-04 ENCOUNTER — Encounter: Payer: Self-pay | Admitting: Internal Medicine

## 2022-08-15 ENCOUNTER — Encounter: Payer: Self-pay | Admitting: Internal Medicine

## 2022-08-15 ENCOUNTER — Ambulatory Visit (AMBULATORY_SURGERY_CENTER): Payer: 59 | Admitting: Internal Medicine

## 2022-08-15 VITALS — BP 116/82 | HR 76 | Temp 97.1°F | Resp 11 | Ht 70.0 in | Wt 192.0 lb

## 2022-08-15 DIAGNOSIS — Z1211 Encounter for screening for malignant neoplasm of colon: Secondary | ICD-10-CM | POA: Diagnosis present

## 2022-08-15 DIAGNOSIS — Z1212 Encounter for screening for malignant neoplasm of rectum: Secondary | ICD-10-CM | POA: Diagnosis not present

## 2022-08-15 DIAGNOSIS — D120 Benign neoplasm of cecum: Secondary | ICD-10-CM

## 2022-08-15 DIAGNOSIS — Z120 Encounter for screening for malignant neoplasm of stomach: Secondary | ICD-10-CM

## 2022-08-15 MED ORDER — SODIUM CHLORIDE 0.9 % IV SOLN: 500.0000 mL | Freq: Once | INTRAVENOUS | Status: DC

## 2022-08-15 NOTE — Progress Notes (Signed)
GASTROENTEROLOGY PROCEDURE H&P NOTE   Primary Care Physician: Shelda Pal, DO    Reason for Procedure:   Colon cancer screening  Plan:    Colonoscopy  Patient is appropriate for endoscopic procedure(s) in the ambulatory ([facility]) setting.  The nature of the procedure, as well as the risks, benefits, and alternatives were carefully and thoroughly reviewed with the patient. Ample time for discussion and questions allowed. The patient understood, was satisfied, and agreed to proceed.     HPI: Shawn Byrd is a 46 y.o. male who presents for colonoscopy for colon cancer screening. Denies blood in stools, changes in bowel habits, or unintentional weight loss. Father had colon cancer at age 73.  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 Authorizing Provider  amLODipine (NORVASC) 5 MG tablet TAKE 1 TABLET(5 MG) BY MOUTH DAILY 04/07/22  Yes Wendling, Crosby Oyster, DO  loratadine (CLARITIN) 10 MG tablet Take 10 mg by mouth daily.   Yes [provider]  rosuvastatin (CRESTOR) 10 MG tablet TAKE 1 TABLET(10 MG) BY MOUTH DAILY 04/07/22  Yes Wendling, Crosby Oyster, DO    Current Outpatient Medications  Medication Sig Dispense Refill   amLODipine (NORVASC) 5 MG tablet TAKE 1 TABLET(5 MG) BY MOUTH DAILY 30 tablet 5   loratadine (CLARITIN) 10 MG tablet Take 10 mg by mouth daily.     rosuvastatin (CRESTOR) 10 MG tablet TAKE 1 TABLET(10 MG) BY MOUTH DAILY 30 tablet 5   Current Facility-Administered Medications  Medication Dose Route Frequency Provider Last Rate Last Admin   0.9 %  sodium chloride infusion  500 mL Intravenous Once Sharyn Creamer, MD        Allergies as of 08/15/2022   (No Known  Allergies)    Family History  Problem Relation Age of Onset   Diabetes Mother    Colon polyps Father    Colon cancer Father    COPD Father    High Cholesterol Father    Colon cancer Maternal Grandfather 82   High Cholesterol Paternal Grandmother    High Cholesterol Paternal Grandfather    Esophageal cancer Neg Hx    Rectal cancer Neg Hx    Stomach cancer Neg Hx     Social History   Socioeconomic History   Marital status: Legally Separated    Spouse name: Not on file   Number of children: Not on file   Years of education: Not on file   Highest education level: Not on file  Occupational History   Not on file  Tobacco Use   Smoking status: Never   Smokeless tobacco: Current    Types: Chew  Vaping Use   Vaping Use: Never used  Substance and Sexual Activity   Alcohol use: Yes    Comment: 3 12 oz beers nightly   Drug use: Never   Sexual activity: Not on file  Other Topics Concern   Not on file  Social History Narrative   Not on file   Social Determinants of Health   Financial Resource Strain: Not on file  Food Insecurity: Not on file  Transportation Needs: Not on file  Physical Activity: Not on file  Stress: Not on file  Social Connections: Not on file  Intimate Partner Violence: Not on file    Physical Exam: Vital signs in last 24 hours: BP (!) 131/97   Pulse 93   Temp (!) 97.1 F (36.2 C) (Temporal)   Ht '5\' 10"'$  (1.778 m)   Wt 192 lb (87.1 kg)   SpO2 97%   BMI 27.55 kg/m  GEN: NAD EYE: Sclerae anicteric ENT: MMM CV: Non-tachycardic Pulm: No increased work of breathing GI: Soft, NT/ND NEURO:  Alert & Oriented   Christia [place], MD [facility]  08/15/2022 10:30 AM

## 2022-08-15 NOTE — Op Note (Signed)
[facility] Patient Name: Shawn Byrd Procedure Date: 08/15/2022 10:39 AM MRN: [id number] Endoscopist: Adline Mango [place] , , [id number] Age: 46 Referring MD:  Date of Birth: 1976/03/05 Gender: Male Account #: [id number] Procedure:                Colonoscopy Indications:              Screening patient at increased risk: Family history                            of 1st-degree relative with colorectal cancer at                            age 53 years (or older) Medicines:                Monitored Anesthesia Care Procedure:                Pre-Anesthesia Assessment:                           - Prior to the procedure, a History and Physical                            was performed, and patient medications and                            allergies were reviewed. The patient's tolerance of                            previous anesthesia was also reviewed. The risks                            and benefits of the procedure and the sedation                            options and risks were discussed with the patient.                            All questions were answered, and informed consent                            was obtained. Prior Anticoagulants: The patient has                            taken no anticoagulant or antiplatelet agents. ASA                            Grade Assessment: II - A patient with mild systemic                            disease. After reviewing the risks and benefits,                            the patient was deemed in satisfactory condition to  undergo the procedure.                           After obtaining informed consent, the colonoscope                            was passed under direct vision. Throughout the                            procedure, the patient's blood pressure, pulse, and                            oxygen saturations were monitored continuously. The                            Olympus CF-HQ190L ([id number])  Colonoscope was                            introduced through the anus and advanced to the the                            terminal ileum. The colonoscopy was performed                            without difficulty. The patient tolerated the                            procedure well. The quality of the bowel                            preparation was good. The terminal ileum, ileocecal                            valve, appendiceal orifice, and rectum were                            photographed. Scope In: 10:41:35 AM Scope Out: 10:56:14 AM Scope Withdrawal Time: 0 hours 12 minutes 47 seconds  Total Procedure Duration: 0 hours 14 minutes 39 seconds  Findings:                 The terminal ileum appeared normal.                           A 3 mm polyp was found in the cecum. The polyp was                            sessile. The polyp was removed with a cold snare.                            Resection and retrieval were complete.                           Multiple diverticula were found in the sigmoid  colon.                           Non-bleeding internal hemorrhoids were found during                            retroflexion. Complications:            No immediate complications. Estimated Blood Loss:     Estimated blood loss was minimal. Impression:               - The examined portion of the ileum was normal.                           - One 3 mm polyp in the cecum, removed with a cold                            snare. Resected and retrieved.                           - Diverticulosis in the sigmoid colon.                           - Non-bleeding internal hemorrhoids. Recommendation:           - Discharge patient to home (with escort).                           - Await pathology results.                           - The findings and recommendations were discussed                            with the patient. Dr Georgian Co "Lyndee Leo" [place],  08/15/2022  10:58:39 AM

## 2022-08-15 NOTE — Patient Instructions (Signed)
Handouts/information given for hemorrhoids, polyps and diverticulosis.YOU HAD AN ENDOSCOPIC PROCEDURE TODAY AT [facility]:   Refer to the procedure report that was given to you for any specific questions about what was found during the examination.  If the procedure report does not answer your questions, please call your gastroenterologist to clarify.  If you requested that your care partner not be given the details of your procedure findings, then the procedure report has been included in a sealed envelope for you to review at your convenience later.  YOU SHOULD EXPECT: Some feelings of bloating in the abdomen. Passage of more gas than usual.  Walking can help get rid of the air that was put into your GI tract during the procedure and reduce the bloating. If you had a lower endoscopy (such as a colonoscopy or flexible sigmoidoscopy) you may notice spotting of blood in your stool or on the toilet paper. If you underwent a bowel prep for your procedure, you may not have a normal bowel movement for a few days.  Please Note:  You might notice some irritation and congestion in your nose or some drainage.  This is from the oxygen used during your procedure.  There is no need for concern and it should clear up in a day or so.  SYMPTOMS TO REPORT IMMEDIATELY:  Following lower endoscopy (colonoscopy):  Excessive amounts of blood in the stool  Significant tenderness or worsening of abdominal pains  Swelling of the abdomen that is new, acute  Fever of 100F or higher  For urgent or emergent issues, a gastroenterologist can be reached at any hour by calling [phone]. Do not use MyChart messaging for urgent concerns.    DIET:  We do recommend a small meal at first, but then you may proceed to your regular diet.  Drink plenty of fluids but you should avoid alcoholic beverages for 24 hours.  ACTIVITY:  You should plan to take it easy for the rest of today and you should NOT DRIVE or use  heavy machinery until tomorrow (because of the sedation medicines used during the test).    FOLLOW UP: Our staff will call the number listed on your records the next business day following your procedure.  We will call around 7:15- 8:00 am to check on you and address any questions or concerns that you may have regarding the information given to you following your procedure. If we do not reach you, we will leave a message.     If any biopsies were taken you will be contacted by phone or by letter within the next 1-3 weeks.  Please call us at [phone] if you have not heard about the biopsies in 3 weeks.    SIGNATURES/CONFIDENTIALITY: You and/or your care partner have signed paperwork which will be entered into your electronic medical record.  These signatures attest to the fact that that the information above on your After Visit Summary has been reviewed and is understood.  Full responsibility of the confidentiality of this discharge information lies with you and/or your care-partner.

## 2022-08-15 NOTE — Progress Notes (Signed)
To pacu, VSS. Report to [email or web address] 

## 2022-08-16 ENCOUNTER — Telehealth: Payer: Self-pay

## 2022-08-16 NOTE — Telephone Encounter (Signed)
Left message on follow up call. 

## 2022-08-17 ENCOUNTER — Encounter: Payer: Self-pay | Admitting: Internal Medicine

## 2022-09-28 ENCOUNTER — Other Ambulatory Visit: Payer: Self-pay | Admitting: Family Medicine

## 2022-09-28 DIAGNOSIS — I10 Essential (primary) hypertension: Secondary | ICD-10-CM

## 2022-09-28 DIAGNOSIS — E782 Mixed hyperlipidemia: Secondary | ICD-10-CM

## 2022-10-10 ENCOUNTER — Ambulatory Visit (INDEPENDENT_AMBULATORY_CARE_PROVIDER_SITE_OTHER): Payer: 59 | Admitting: Family Medicine

## 2022-10-10 ENCOUNTER — Other Ambulatory Visit: Payer: Self-pay | Admitting: Family Medicine

## 2022-10-10 ENCOUNTER — Encounter: Payer: Self-pay | Admitting: Family Medicine

## 2022-10-10 VITALS — BP 118/68 | HR 95 | Temp 98.0°F | Ht 70.0 in | Wt 195.2 lb

## 2022-10-10 DIAGNOSIS — Z0000 Encounter for general adult medical examination without abnormal findings: Secondary | ICD-10-CM | POA: Diagnosis not present

## 2022-10-10 DIAGNOSIS — E782 Mixed hyperlipidemia: Secondary | ICD-10-CM

## 2022-10-10 LAB — COMPREHENSIVE METABOLIC PANEL
ALT: 18 U/L (ref 0–53)
AST: 17 U/L (ref 0–37)
Albumin: 4.7 g/dL (ref 3.5–5.2)
Alkaline Phosphatase: 58 U/L (ref 39–117)
BUN: 13 mg/dL (ref 6–23)
CO2: 30 mEq/L (ref 19–32)
Calcium: 9.8 mg/dL (ref 8.4–10.5)
Chloride: 102 mEq/L (ref 96–112)
Creatinine, Ser: 1.16 mg/dL (ref 0.40–1.50)
GFR: 74.99 mL/min (ref >60.00)
Glucose, Bld: 97 mg/dL (ref 70–99)
Potassium: 4.7 mEq/L (ref 3.5–5.1)
Sodium: 139 mEq/L (ref 135–145)
Total Bilirubin: 0.6 mg/dL (ref 0.2–1.2)
Total Protein: 7.2 g/dL (ref 6.0–8.3)

## 2022-10-10 LAB — CBC
HCT: 45.9 % (ref 39.0–52.0)
Hemoglobin: 15.7 g/dL (ref 13.0–17.0)
MCHC: 34.3 g/dL (ref 30.0–36.0)
MCV: 94.3 fl (ref 78.0–100.0)
Platelets: 242 10*3/uL (ref 150.0–400.0)
RBC: 4.87 Mil/uL (ref 4.22–5.81)
RDW: 12.6 % (ref 11.5–15.5)
WBC: 5.9 10*3/uL (ref 4.0–10.5)

## 2022-10-10 LAB — LIPID PANEL
Cholesterol: 166 mg/dL (ref 0–200)
HDL: 59.4 mg/dL (ref >39.00)
LDL Cholesterol: 67 mg/dL (ref 0–99)
NonHDL: 106.76
Total CHOL/HDL Ratio: 3
Triglycerides: 197 mg/dL — ABNORMAL HIGH (ref 0.0–149.0)
VLDL: 39.4 mg/dL (ref 0.0–40.0)

## 2022-10-10 NOTE — Progress Notes (Signed)
Chief Complaint  Patient presents with   Annual Exam    Well Male Shawn Byrd is here for a complete physical.   His last physical was >1 year ago.  Current diet: in general, a "healthy" diet.   Current exercise: active at work Weight trend: stable Fatigue out of ordinary? No. Seat belt? Yes.   Advanced directive? No  Health maintenance Tetanus- Yes HIV- Yes Hep C- Yes  Past Medical History:  Diagnosis Date   Allergy    seasonal / environmental   Basal cell adenocarcinoma 2023   left ear   Hyperlipidemia    Hypertension      Past Surgical History:  Procedure Laterality Date   NO PAST SURGERIES      Medications  Current Outpatient Medications on File Prior to Visit  Medication Sig Dispense Refill   amLODipine (NORVASC) 5 MG tablet TAKE 1 TABLET BY MOUTH EVERY DAY 30 tablet 5   loratadine (CLARITIN) 10 MG tablet Take 10 mg by mouth daily.     rosuvastatin (CRESTOR) 10 MG tablet TAKE 1 TABLET BY MOUTH EVERY DAY 30 tablet 5   Allergies No Known Allergies  Family History Family History  Problem Relation Age of Onset   Diabetes Mother    Colon polyps Father    Colon cancer Father    COPD Father    High Cholesterol Father    Colon cancer Maternal Grandfather 91   High Cholesterol Paternal Grandmother    High Cholesterol Paternal Grandfather    Esophageal cancer Neg Hx    Rectal cancer Neg Hx    Stomach cancer Neg Hx     Review of Systems: Constitutional: no fevers or chills Eye:  no recent significant change in vision Ear/Nose/Mouth/Throat:  Ears:  no hearing loss Nose/Mouth/Throat:  no complaints of nasal congestion, no sore throat Cardiovascular:  no chest pain Respiratory:  no shortness of breath Gastrointestinal:  no abdominal pain, no change in bowel habits GU:  Male: negative for dysuria, frequency, and incontinence Musculoskeletal/Extremities:  no pain of the joints Integumentary (Skin/Breast):  no abnormal skin lesions reported Neurologic:   no headaches Endocrine: No unexpected weight changes Hematologic/Lymphatic:  no night sweats  Exam BP 118/68 (BP Location: Left Arm, Patient Position: Sitting, Cuff Size: Normal)   Pulse 95   Temp 98 F (36.7 C) (Oral)   Ht '5\' 10"'$  (1.778 m)   Wt 195 lb 4 oz (88.6 kg)   SpO2 96%   BMI 28.02 kg/m  General:  well developed, well nourished, in no apparent distress Skin:  no significant moles, warts, or growths Head:  no masses, lesions, or tenderness Eyes:  pupils equal and round, sclera anicteric without injection Ears:  canals without lesions, TMs shiny without retraction, no obvious effusion, no erythema Nose:  nares patent, mucosa normal Throat/Pharynx:  lips and gingiva without lesion; tongue and uvula midline; non-inflamed pharynx; no exudates or postnasal drainage Neck: neck supple without adenopathy, thyromegaly, or masses Lungs:  clear to auscultation, breath sounds equal bilaterally, no respiratory distress Cardio:  regular rate and rhythm, no bruits, no LE edema Abdomen:  abdomen soft, nontender; bowel sounds normal; no masses or organomegaly Rectal: Deferred Musculoskeletal:  symmetrical muscle groups noted without atrophy or deformity Extremities:  no clubbing, cyanosis, or edema, no deformities, no skin discoloration Neuro:  gait normal; deep tendon reflexes normal and symmetric Psych: well oriented with normal range of affect and appropriate judgment/insight  Assessment and Plan  Well adult exam - Plan: CBC, Comprehensive  metabolic panel, Lipid panel   Well 47 y.o. male. Counseled on diet and exercise. Advanced directive form provided today.  COVID vaccine discussed.  Other orders as above. Follow up in 6 mo pending the above workup. The patient voiced understanding and agreement to the plan.  [place], DO 10/10/22 10:29 AM

## 2022-10-10 NOTE — Patient Instructions (Signed)
Give us 2-3 business days to get the results of your labs back.   Keep the diet clean and stay active.  Please get me a copy of your advanced directive form at your convenience.   Let us know if you need anything.  

## 2022-11-21 ENCOUNTER — Encounter: Payer: Self-pay | Admitting: Family Medicine

## 2022-11-21 ENCOUNTER — Other Ambulatory Visit: Payer: Self-pay | Admitting: Family Medicine

## 2022-11-21 ENCOUNTER — Other Ambulatory Visit (INDEPENDENT_AMBULATORY_CARE_PROVIDER_SITE_OTHER): Payer: 59

## 2022-11-21 DIAGNOSIS — E782 Mixed hyperlipidemia: Secondary | ICD-10-CM

## 2022-11-21 LAB — HEPATIC FUNCTION PANEL
ALT: 32 U/L (ref 0–53)
AST: 23 U/L (ref 0–37)
Albumin: 4.4 g/dL (ref 3.5–5.2)
Alkaline Phosphatase: 64 U/L (ref 39–117)
Bilirubin, Direct: 0.1 mg/dL (ref 0.0–0.3)
Total Bilirubin: 0.6 mg/dL (ref 0.2–1.2)
Total Protein: 7.1 g/dL (ref 6.0–8.3)

## 2022-11-21 LAB — LIPID PANEL
Cholesterol: 156 mg/dL (ref 0–200)
HDL: 53.3 mg/dL (ref >39.00)
NonHDL: 102.5
Total CHOL/HDL Ratio: 3
Triglycerides: 210 mg/dL — ABNORMAL HIGH (ref 0.0–149.0)
VLDL: 42 mg/dL — ABNORMAL HIGH (ref 0.0–40.0)

## 2022-11-21 LAB — LDL CHOLESTEROL, DIRECT: Direct LDL: 81 mg/dL

## 2022-11-21 MED ORDER — ROSUVASTATIN CALCIUM 20 MG PO TABS: 20.0000 mg | ORAL_TABLET | Freq: Every day | ORAL | 3 refills | Status: DC

## 2023-04-24 ENCOUNTER — Other Ambulatory Visit: Payer: Self-pay | Admitting: Family Medicine

## 2023-04-24 DIAGNOSIS — I10 Essential (primary) hypertension: Secondary | ICD-10-CM

## 2023-10-22 ENCOUNTER — Other Ambulatory Visit: Payer: Self-pay | Admitting: Family Medicine

## 2023-10-22 DIAGNOSIS — I10 Essential (primary) hypertension: Secondary | ICD-10-CM

## 2023-10-23 ENCOUNTER — Other Ambulatory Visit: Payer: Self-pay | Admitting: Family Medicine

## 2024-04-17 ENCOUNTER — Encounter: Payer: Self-pay | Admitting: Dermatology

## 2024-05-04 ENCOUNTER — Other Ambulatory Visit: Payer: Self-pay | Admitting: Family Medicine

## 2024-05-04 DIAGNOSIS — I10 Essential (primary) hypertension: Secondary | ICD-10-CM

## 2024-08-13 ENCOUNTER — Encounter: Payer: Self-pay | Admitting: Family Medicine

## 2024-08-20 ENCOUNTER — Ambulatory Visit: Payer: Self-pay | Admitting: Family Medicine

## 2024-08-20 ENCOUNTER — Encounter: Payer: Self-pay | Admitting: Family Medicine

## 2024-08-20 ENCOUNTER — Ambulatory Visit: Admitting: Family Medicine

## 2024-08-20 VITALS — BP 132/84 | HR 100 | Temp 98.0°F | Resp 16 | Ht 70.0 in | Wt 186.2 lb

## 2024-08-20 DIAGNOSIS — E782 Mixed hyperlipidemia: Secondary | ICD-10-CM | POA: Diagnosis not present

## 2024-08-20 DIAGNOSIS — F5104 Psychophysiologic insomnia: Secondary | ICD-10-CM | POA: Insufficient documentation

## 2024-08-20 DIAGNOSIS — I10 Essential (primary) hypertension: Secondary | ICD-10-CM | POA: Diagnosis not present

## 2024-08-20 LAB — COMPREHENSIVE METABOLIC PANEL WITH GFR
ALT: 20 U/L (ref 0–53)
AST: 17 U/L (ref 0–37)
Albumin: 5 g/dL (ref 3.5–5.2)
Alkaline Phosphatase: 62 U/L (ref 39–117)
BUN: 14 mg/dL (ref 6–23)
CO2: 32 meq/L (ref 19–32)
Calcium: 10.1 mg/dL (ref 8.4–10.5)
Chloride: 103 meq/L (ref 96–112)
Creatinine, Ser: 1.2 mg/dL (ref 0.40–1.50)
GFR: 71.07 mL/min (ref >60.00)
Glucose, Bld: 96 mg/dL (ref 70–99)
Potassium: 4.5 meq/L (ref 3.5–5.1)
Sodium: 140 meq/L (ref 135–145)
Total Bilirubin: 0.6 mg/dL (ref 0.2–1.2)
Total Protein: 7.4 g/dL (ref 6.0–8.3)

## 2024-08-20 LAB — LIPID PANEL
Cholesterol: 158 mg/dL (ref 0–200)
HDL: 47.4 mg/dL (ref >39.00)
LDL Cholesterol: 84 mg/dL (ref 0–99)
NonHDL: 110.11
Total CHOL/HDL Ratio: 3
Triglycerides: 130 mg/dL (ref 0.0–149.0)
VLDL: 26 mg/dL (ref 0.0–40.0)

## 2024-08-20 MED ORDER — AMLODIPINE BESYLATE 5 MG PO TABS: 5.0000 mg | ORAL_TABLET | Freq: Every day | ORAL | 5 refills | Status: AC

## 2024-08-20 MED ORDER — ZOLPIDEM TARTRATE 5 MG PO TABS: 5.0000 mg | ORAL_TABLET | Freq: Every evening | ORAL | 1 refills | Status: AC | PRN

## 2024-08-20 MED ORDER — ROSUVASTATIN CALCIUM 20 MG PO TABS: 20.0000 mg | ORAL_TABLET | Freq: Every day | ORAL | 5 refills | Status: AC

## 2024-08-20 NOTE — Patient Instructions (Addendum)
 Please consider counseling. Contact [phone] to schedule an appointment or inquire about cost/insurance coverage.  [facility] located at [address], [address], [place], [place].  Phone number = [phone].  Dr. Verdel Silk - Adult Psychiatry.    [facility] located at [address] [place], [place], [place]. Phone number = [phone].   [facility] located at [address] [place], [place].  Phone number = [phone].   [facility] located at [address] [place], [place], [place].  Phone number = [phone].  Keep the diet clean and stay active.  Give us  2-3 business days to get the results of your labs back.   Let us  know if you need anything.

## 2024-08-20 NOTE — Progress Notes (Signed)
 Chief Complaint  Patient presents with   Insomnia    Insomnia     Subjective: Patient is a 48 y.o. male here for insomnia.  3 months ago, the patient started experiencing insomnia.  It started after having more stress as he is going through a divorce.  He has difficulty turning his mind off and experiences racing thoughts in the middle of the night.  He has tried various over-the-counter options such as melatonin, magnesium, and various sleep aids.  He may fall asleep all right but does not stay asleep.  He went to urgent care for COVID recently and was prescribed Ambien 5 mg nightly as needed.  He reports it did help significantly.  He went from sleeping around 3 hours per night to sleeping 7 hours nightly.  He does not wish to be on a daily medication.  He is not seeing a therapist or counselor.  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  Will continue Ambien for now.  We discussed habit-forming potential with this medication and adverse reactions.  He reports taking only on nights where he works in the next day.  Hopefully divorce proceedings will wrap up in March and we can wean off of this medication.  Counseling information provided.  Follow-up in 6 weeks for physical. The patient voiced understanding  and agreement to the plan.  Mabel [place], DO 08/20/24  11:14 AM
